# Patient Record
Sex: Female | Born: 1960 | Race: Black or African American | Hispanic: No | Marital: Single | Smoking: Former smoker
Health system: Southern US, Community
[De-identification: ages and names within clinical notes are randomized; demographics above are authoritative.]

## PROBLEM LIST (undated history)

## (undated) HISTORY — PX: REVISION OF SCAR TISSUE RECTUS MUSCLE: SHX2351

## (undated) HISTORY — PX: OTHER SURGICAL HISTORY: SHX169

---

## 2012-07-08 ENCOUNTER — Encounter (HOSPITAL_COMMUNITY): Payer: Self-pay

## 2012-07-08 ENCOUNTER — Emergency Department (HOSPITAL_COMMUNITY)
Admission: EM | Admit: 2012-07-08 | Discharge: 2012-07-08 | Disposition: A | Discharge status: Home / Self Care | Payer: Self-pay | Attending: Emergency Medicine | Admitting: Emergency Medicine

## 2012-07-08 DIAGNOSIS — M5432 Sciatica, left side: Secondary | ICD-10-CM

## 2012-07-08 DIAGNOSIS — M5430 Sciatica, unspecified side: Secondary | ICD-10-CM | POA: Insufficient documentation

## 2012-07-08 DIAGNOSIS — Z87891 Personal history of nicotine dependence: Secondary | ICD-10-CM | POA: Insufficient documentation

## 2012-07-08 MED ORDER — TRAMADOL HCL 50 MG PO TABS: 50.0000 mg | ORAL_TABLET | Freq: Four times a day (QID) | ORAL | Status: DC | PRN

## 2012-07-08 MED ORDER — OXYCODONE-ACETAMINOPHEN 5-325 MG PO TABS
1.0000 | ORAL_TABLET | Freq: Once | ORAL | Status: AC
  Administered 2012-07-08: 1 via ORAL
  Filled 2012-07-08: qty 1

## 2012-07-08 MED ORDER — OXYCODONE-ACETAMINOPHEN 5-325 MG PO TABS: 1.0000 | ORAL_TABLET | Freq: Four times a day (QID) | ORAL | Status: AC | PRN

## 2012-07-08 MED ORDER — PREDNISONE 10 MG PO TABS: 20.0000 mg | ORAL_TABLET | Freq: Every day | ORAL | Status: DC

## 2012-07-08 MED ORDER — CYCLOBENZAPRINE HCL 10 MG PO TABS: 10.0000 mg | ORAL_TABLET | Freq: Two times a day (BID) | ORAL | Status: AC | PRN

## 2012-07-08 MED ORDER — DEXAMETHASONE SODIUM PHOSPHATE 10 MG/ML IJ SOLN
10.0000 mg | Freq: Once | INTRAMUSCULAR | Status: AC
  Administered 2012-07-08: 10 mg via INTRAMUSCULAR
  Filled 2012-07-08: qty 1
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
## 2012-07-08 NOTE — ED Notes (Signed)
Pt reports (L) knee pain radiating into (L) hip up to (L) shoulder/back/neck region starting yesterday. Pt reports a hx of nerve pain

## 2012-07-09 NOTE — ED Provider Notes (Signed)
Medical screening examination/treatment/procedure(s) were performed by non-physician practitioner and as supervising physician I was immediately available for consultation/collaboration.  Suleyma Wafer, MD 07/09/12 0732 

## 2016-10-01 ENCOUNTER — Emergency Department (HOSPITAL_COMMUNITY)
Admission: EM | Admit: 2016-10-01 | Discharge: 2016-10-01 | Disposition: A | Discharge status: Home / Self Care | Payer: Self-pay | Attending: Emergency Medicine | Admitting: Emergency Medicine

## 2016-10-01 DIAGNOSIS — Z5321 Procedure and treatment not carried out due to patient leaving prior to being seen by health care provider: Secondary | ICD-10-CM | POA: Insufficient documentation

## 2016-10-01 DIAGNOSIS — R21 Rash and other nonspecific skin eruption: Secondary | ICD-10-CM | POA: Insufficient documentation

## 2016-10-01 DIAGNOSIS — R079 Chest pain, unspecified: Secondary | ICD-10-CM | POA: Insufficient documentation

## 2016-10-01 DIAGNOSIS — Z7982 Long term (current) use of aspirin: Secondary | ICD-10-CM | POA: Insufficient documentation

## 2016-10-01 DIAGNOSIS — Z87891 Personal history of nicotine dependence: Secondary | ICD-10-CM | POA: Insufficient documentation

## 2016-10-01 NOTE — ED Notes (Signed)
Pt no longer in lobby.  Will d/c LWBS.

## 2016-10-01 NOTE — ED Triage Notes (Addendum)
Pt from home with initial complaints of chest pain that she describes as burning in her central chest region. Pt is scratching at her skin during triage. Pt states she has had similar pain in her chest on thanksgiving. Pt denies radiation of pain. Pt denies SOB or dizziness. Pt denies nausea. Pt rates her pain at 9/10 Pt states that most of her pain is on her skin of her chest Pt has red raised areas covering her abdomen. Pt states that this began on Thanksgiving, and has come and gone since then.  Pt states symptoms got worse when she was sitting in front of her fireplace

## 2016-10-03 ENCOUNTER — Encounter (HOSPITAL_COMMUNITY): Payer: Self-pay | Admitting: Emergency Medicine

## 2016-10-03 ENCOUNTER — Emergency Department (HOSPITAL_COMMUNITY)
Admission: EM | Admit: 2016-10-03 | Discharge: 2016-10-03 | Disposition: A | Discharge status: Home / Self Care | Payer: Self-pay | Attending: Emergency Medicine | Admitting: Emergency Medicine

## 2016-10-03 DIAGNOSIS — R21 Rash and other nonspecific skin eruption: Secondary | ICD-10-CM | POA: Insufficient documentation

## 2016-10-03 DIAGNOSIS — Z7982 Long term (current) use of aspirin: Secondary | ICD-10-CM | POA: Insufficient documentation

## 2016-10-03 DIAGNOSIS — Z87891 Personal history of nicotine dependence: Secondary | ICD-10-CM | POA: Insufficient documentation

## 2016-10-03 MED ORDER — PREDNISONE 10 MG (21) PO TBPK: 10.0000 mg | ORAL_TABLET | Freq: Every day | ORAL | 0 refills | Status: AC

## 2016-10-03 MED ORDER — TRIAMCINOLONE ACETONIDE 0.1 % EX OINT: 1.0000 "application " | TOPICAL_OINTMENT | Freq: Two times a day (BID) | CUTANEOUS | 0 refills | Status: AC

## 2016-10-03 MED ORDER — CARRINGTON MOISTURE BARRIER EX CREA: TOPICAL_CREAM | CUTANEOUS | 1 refills | Status: AC | PRN

## 2016-10-03 MED ORDER — HYDROXYZINE HCL 10 MG PO TABS: 10.0000 mg | ORAL_TABLET | Freq: Four times a day (QID) | ORAL | 0 refills | Status: AC | PRN

## 2016-10-03 NOTE — ED Provider Notes (Signed)
[facility]-EMERGENCY DEPT Provider Note   CSN: [id number] Arrival date & time: 10/03/16  1003  By signing my name below, I, Emmanuella Mensah, attest that this documentation has been prepared under the direction and in the presence of Mathews Robinsons, PA-C. Electronically Signed: Angelene Giovanni, ED Scribe. 10/03/16. 11:14 AM.   History   Chief Complaint Chief Complaint  Patient presents with  . Rash    HPI Comments: Maria Chambers is a 55 y.o. female with a hx of eczema who presents to the Emergency Department complaining of gradually worsening diffuse severely itchy and burning rash she describes as "bees stinging" to her neck, chest, abdomen, and bilateral legs onset one month ago, worsening 3 weeks ago. She notes that the rash is worse under her bilateral breast and although the rash is not on her face, she has associated bilateral eye puffiness with itching. She adds that the rash has begun to spread to her scalp yesterday. She denies that the rash has spread to her genital area. She states that her rash began in her bilateral popliteal regions and bilateral antecubital fossa but she initially attributed the rash to her hx of eczema. She denies any new foods, medications, or lotions but states that one month ago, she began wearing a new apple watch and switching to a lavender soap and new detergent. She adds that her symptoms could be due to exposure to dust mites as well. No alleviating factors noted. She states that she has tried Triamcinolone cream with no relief. 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 She denies any fever, chills, neck stiffness, abdominal pain, chest pain, cough, shortness of breath, sore throat, trouble swallowing, lip/tongue swelling, bilateral leg swelling, rash to the palm/soles of feet, oral mucosa or any other symptoms. Patient states "I could go for a run right now" as she is feeling great other than the  pruritus.  The history is provided by the patient. No language interpreter was used.    History reviewed. No pertinent past medical history.  There are no active problems to display for this patient.   Past Surgical History:  Procedure Laterality Date  . ovalectomy    . REVISION OF SCAR TISSUE RECTUS MUSCLE      OB History    No data available       Home Medications    Prior to Admission medications   Medication Sig Start Date End Date Taking? Authorizing Provider  aspirin-acetaminophen-caffeine (EXCEDRIN MIGRAINE) [phone] MG per tablet Take 1 tablet by mouth every 6 (six) hours as needed. For pain    Historical Provider, MD  hydrOXYzine (ATARAX/VISTARIL) 10 MG tablet Take 1 tablet (10 mg total) by mouth every 6 (six) hours as needed for itching. 10/03/16   Georgiana Shore, PA-C  ibuprofen (ADVIL,MOTRIN) 200 MG tablet Take 400 mg by mouth every 6 (six) hours as needed.    Historical Provider, MD  predniSONE (STERAPRED UNI-PAK 21 TAB) 10 MG (21) TBPK tablet Take 1 tablet (10 mg total) by mouth daily. Take 6 tabs by mouth daily  for 2 days, then 5 tabs for 2 days, then 4 tabs for 2 days, then 3 tabs for 2 days, 2 tabs for 2 days, then 1 tab by mouth daily for 2 days 10/03/16   Georgiana Shore, PA-C  Skin Protectants, Misc. (EUCERIN) cream Apply topically as needed. 10/03/16   Georgiana Shore, PA-C  triamcinolone ointment (KENALOG) 0.1 % Apply 1  application topically 2 (two) times daily. 10/03/16   Georgiana ShoreJessica B Charmaine Placido, PA-C    Family History No family history on file.  Social History Social History  Substance Use Topics  . Smoking status: Former Games developermoker  . Smokeless tobacco: Former NeurosurgeonUser  . Alcohol use Yes     Comment: social     Allergies   Patient has no known allergies.   Review of Systems Review of Systems  Constitutional: Negative for chills and fever.  HENT: Negative for facial swelling, sore throat and trouble swallowing.   Respiratory: Negative for  cough and shortness of breath.   Cardiovascular: Negative for chest pain and leg swelling.  Gastrointestinal: Negative for abdominal pain.  Musculoskeletal: Negative for neck stiffness.  Skin: Positive for rash.     Physical Exam Updated Vital Signs BP 150/89 (BP Location: Right Arm)   Pulse 91   Temp 97.9 F (36.6 C) (Oral)   Resp 20   LMP 09/26/2016   SpO2 99%   Physical Exam  Constitutional: She appears well-developed and well-nourished. No distress.  HENT:  Head: Normocephalic and atraumatic.  Right Ear: Tympanic membrane normal.  Left Ear: Tympanic membrane normal.  Mouth/Throat: Oropharynx is clear and moist.  Eyes: EOM are normal. Pupils are equal, round, and reactive to light. Right eye exhibits no discharge. Left eye exhibits no discharge.  Pulmonary/Chest: Effort normal. No respiratory distress. She exhibits no tenderness.  Abdominal: She exhibits no distension.  Neurological: She is alert. Coordination normal.  Skin: Skin is warm and dry. Rash noted. She is not diaphoretic. There is erythema. No pallor.  Diffuse dry skin with erythematous scaly patches over antecubital and popliteal fossa, abdomen and infra mammary region  Psychiatric: She has a normal mood and affect. Her behavior is normal.  Nursing note and vitals reviewed.    ED Treatments / Results  DIAGNOSTIC STUDIES: Oxygen Saturation is 99% on RA, normal by my interpretation.    COORDINATION OF CARE: 10:56 AM- Pt advised of plan for treatment and pt agrees. Pt will receive prednisone, atarax, Eucerin cream, and triamcinolone.    Labs (all labs ordered are listed, but only abnormal results are displayed) Labs Reviewed - No data to display  EKG  EKG Interpretation None       Radiology No results found.  Procedures Procedures (including critical care time)  Medications Ordered in ED Medications - No data to display   Initial Impression / Assessment and Plan / ED Course  Mathews RobinsonsJessica  Damek Ende, PA-C has reviewed the triage vital signs and the nursing notes.  Pertinent labs & imaging results that were available during my care of the patient were reviewed by me and considered in my medical decision making (see chart for details).  Clinical Course    55 y/o female with no past medical history other than eczema presenting with one month of progressively worsening rash which began in popliteal and antecubital fossa as her typical eczema and spread to her torso, neck, scalp, and upper anterior  thighs over the last 3 weeks.  She denies any systemic symptoms, fever/chills, N/V, C/P, SOB, abdominal pain, neck stiffness. She is otherwise feeling well and has no other concerns. This rash looks like her eczema, but widespread which is a first occurrence for her. She did note some changes in her detergent and  body soap and is concern for dust mites.   On exam, her skin was very dry and scaly with worse plaques located in popliteal and antecubital fossa.  Discharge home with steroid taper and emmolient cream and close follow up with primary care.  Patient was also seen by Supervising PA Will Dansie who agrees with assessment and plan and recommended oral steroid taper with topical triamcinolone 0.1% and hydroxyzine.  Patient was given strict return precautions and understand to return to the Emergency department if experiencing, SOB, fever, neck stiffness, swelling or any other worsening symptoms.     Final Clinical Impressions(s) / ED Diagnoses   Final diagnoses:  Rash and nonspecific skin eruption    New Prescriptions Discharge Medication List as of 10/03/2016 11:13 AM    START taking these medications   Details  hydrOXYzine (ATARAX/VISTARIL) 10 MG tablet Take 1 tablet (10 mg total) by mouth every 6 (six) hours as needed for itching., Starting Sat 10/03/2016, Print    predniSONE (STERAPRED UNI-PAK 21 TAB) 10 MG (21) TBPK tablet Take 1 tablet (10 mg total) by mouth daily. Take  6 tabs by mouth daily  for 2 days, then 5 tabs for 2 days, then 4 tabs for 2 days, then 3 tabs for 2 days, 2 tabs for 2 days, then 1 tab by mouth daily for 2 days, Starting Sat 10/03/2016, Print    Skin Protectants, Misc. (EUCERIN) cream Apply topically as needed., Starting Sat 10/03/2016, Print    triamcinolone ointment (KENALOG) 0.1 % Apply 1 application topically 2 (two) times daily., Starting Sat 10/03/2016, Print       I personally performed the services described in this documentation, which was scribed in my presence. The recorded information has been reviewed and is accurate.    Georgiana ShoreJessica B Cyera Balboni, PA-C 10/03/16 1248    Georgiana ShoreJessica B Sharlena Kristensen, PA-C 10/03/16 2134    Georgiana ShoreJessica B Gaylord Seydel, PA-C 10/03/16 2146    Laurence Spatesachel Morgan Little, MD 10/04/16 [phone]

## 2021-03-06 ENCOUNTER — Other Ambulatory Visit (HOSPITAL_COMMUNITY)
Admission: RE | Admit: 2021-03-06 | Discharge: 2021-03-06 | Disposition: A | Discharge status: Home / Self Care | Payer: Self-pay | Source: Ambulatory Visit | Attending: Family Medicine | Admitting: Family Medicine

## 2021-03-06 ENCOUNTER — Other Ambulatory Visit: Payer: Self-pay | Admitting: Family Medicine

## 2021-03-06 DIAGNOSIS — Z124 Encounter for screening for malignant neoplasm of cervix: Secondary | ICD-10-CM | POA: Insufficient documentation

## 2021-03-11 LAB — CYTOLOGY - PAP
Chlamydia: NEGATIVE
Comment: NEGATIVE
Comment: NEGATIVE
Comment: NEGATIVE
Comment: NORMAL
Diagnosis: NEGATIVE
High risk HPV: NEGATIVE
Neisseria Gonorrhea: NEGATIVE
Trichomonas: NEGATIVE

## 2021-08-14 ENCOUNTER — Other Ambulatory Visit: Payer: Self-pay

## 2021-08-14 ENCOUNTER — Emergency Department (HOSPITAL_BASED_OUTPATIENT_CLINIC_OR_DEPARTMENT_OTHER): Payer: No Typology Code available for payment source

## 2021-08-14 ENCOUNTER — Encounter (HOSPITAL_BASED_OUTPATIENT_CLINIC_OR_DEPARTMENT_OTHER): Payer: Self-pay | Admitting: *Deleted

## 2021-08-14 ENCOUNTER — Emergency Department (HOSPITAL_BASED_OUTPATIENT_CLINIC_OR_DEPARTMENT_OTHER)
Admission: EM | Admit: 2021-08-14 | Discharge: 2021-08-15 | Disposition: A | Discharge status: Home / Self Care | Payer: No Typology Code available for payment source | Attending: Emergency Medicine | Admitting: Emergency Medicine

## 2021-08-14 DIAGNOSIS — S299XXA Unspecified injury of thorax, initial encounter: Secondary | ICD-10-CM | POA: Diagnosis not present

## 2021-08-14 DIAGNOSIS — S40021A Contusion of right upper arm, initial encounter: Secondary | ICD-10-CM | POA: Insufficient documentation

## 2021-08-14 DIAGNOSIS — Z87891 Personal history of nicotine dependence: Secondary | ICD-10-CM | POA: Insufficient documentation

## 2021-08-14 DIAGNOSIS — M5134 Other intervertebral disc degeneration, thoracic region: Secondary | ICD-10-CM | POA: Diagnosis not present

## 2021-08-14 DIAGNOSIS — Y9389 Activity, other specified: Secondary | ICD-10-CM | POA: Diagnosis not present

## 2021-08-14 DIAGNOSIS — S199XXA Unspecified injury of neck, initial encounter: Secondary | ICD-10-CM | POA: Insufficient documentation

## 2021-08-14 DIAGNOSIS — Y92410 Unspecified street and highway as the place of occurrence of the external cause: Secondary | ICD-10-CM | POA: Diagnosis not present

## 2021-08-14 MED ORDER — KETOROLAC TROMETHAMINE 15 MG/ML IJ SOLN
30.0000 mg | Freq: Once | INTRAMUSCULAR | Status: DC
  Filled 2021-08-14: qty 2
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
## 2021-08-14 NOTE — Discharge Instructions (Addendum)
You were evaluated in the Emergency Department and after careful evaluation, we did not find any emergent condition requiring admission or further testing in the hospital.  Your exam/testing today was overall reassuring.  Your imaging revealed no acute fractures or traumatic injuries.  Please return to the Emergency Department if you experience any worsening of your condition.  Thank you for allowing [place] to be a part of your care.
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
